# Patient Record
Sex: Male | Born: 1945 | Race: Black or African American | Hispanic: No | Smoking: Never smoker
Health system: Southern US, Community
[De-identification: ages and names within clinical notes are randomized; demographics above are authoritative.]

## PROBLEM LIST (undated history)

## (undated) DIAGNOSIS — I10 Essential (primary) hypertension: Secondary | ICD-10-CM

---

## 2012-02-19 ENCOUNTER — Other Ambulatory Visit: Payer: Self-pay | Admitting: Ophthalmology

## 2017-07-06 DIAGNOSIS — H401131 Primary open-angle glaucoma, bilateral, mild stage: Secondary | ICD-10-CM | POA: Diagnosis not present

## 2017-12-26 DIAGNOSIS — E161 Other hypoglycemia: Secondary | ICD-10-CM | POA: Diagnosis not present

## 2017-12-26 DIAGNOSIS — E162 Hypoglycemia, unspecified: Secondary | ICD-10-CM | POA: Diagnosis not present

## 2017-12-26 DIAGNOSIS — R410 Disorientation, unspecified: Secondary | ICD-10-CM | POA: Diagnosis not present

## 2017-12-26 DIAGNOSIS — R404 Transient alteration of awareness: Secondary | ICD-10-CM | POA: Diagnosis not present

## 2018-01-11 DIAGNOSIS — H401131 Primary open-angle glaucoma, bilateral, mild stage: Secondary | ICD-10-CM | POA: Diagnosis not present

## 2020-06-06 DIAGNOSIS — Z20822 Contact with and (suspected) exposure to covid-19: Secondary | ICD-10-CM | POA: Diagnosis not present

## 2020-10-09 ENCOUNTER — Encounter (HOSPITAL_COMMUNITY): Payer: Self-pay | Admitting: Emergency Medicine

## 2020-10-09 ENCOUNTER — Other Ambulatory Visit: Payer: Self-pay

## 2020-10-09 ENCOUNTER — Observation Stay (HOSPITAL_COMMUNITY)
Admission: EM | Admit: 2020-10-09 | Discharge: 2020-10-10 | Disposition: A | Discharge status: Home / Self Care | Payer: Medicare Other | Attending: Internal Medicine | Admitting: Internal Medicine

## 2020-10-09 ENCOUNTER — Emergency Department (HOSPITAL_COMMUNITY): Payer: Medicare Other

## 2020-10-09 DIAGNOSIS — I120 Hypertensive chronic kidney disease with stage 5 chronic kidney disease or end stage renal disease: Secondary | ICD-10-CM | POA: Diagnosis not present

## 2020-10-09 DIAGNOSIS — Z79899 Other long term (current) drug therapy: Secondary | ICD-10-CM | POA: Diagnosis not present

## 2020-10-09 DIAGNOSIS — R55 Syncope and collapse: Secondary | ICD-10-CM | POA: Diagnosis not present

## 2020-10-09 DIAGNOSIS — Z20822 Contact with and (suspected) exposure to covid-19: Secondary | ICD-10-CM | POA: Diagnosis not present

## 2020-10-09 DIAGNOSIS — N185 Chronic kidney disease, stage 5: Secondary | ICD-10-CM | POA: Insufficient documentation

## 2020-10-09 DIAGNOSIS — R4020 Unspecified coma: Secondary | ICD-10-CM | POA: Diagnosis present

## 2020-10-09 DIAGNOSIS — I959 Hypotension, unspecified: Secondary | ICD-10-CM | POA: Insufficient documentation

## 2020-10-09 DIAGNOSIS — N189 Chronic kidney disease, unspecified: Secondary | ICD-10-CM

## 2020-10-09 HISTORY — DX: Essential (primary) hypertension: I10

## 2020-10-09 LAB — URINALYSIS, ROUTINE W REFLEX MICROSCOPIC
Bilirubin Urine: NEGATIVE
Glucose, UA: 150 mg/dL — AB
Ketones, ur: NEGATIVE mg/dL
Leukocytes,Ua: NEGATIVE
Nitrite: NEGATIVE
Protein, ur: 30 mg/dL — AB
Specific Gravity, Urine: 1.009 (ref 1.005–1.030)
pH: 5 (ref 5.0–8.0)

## 2020-10-09 LAB — CBC WITH DIFFERENTIAL/PLATELET
Abs Immature Granulocytes: 0.01 10*3/uL (ref 0.00–0.07)
Basophils Absolute: 0.1 10*3/uL (ref 0.0–0.1)
Basophils Relative: 1 %
Eosinophils Absolute: 0.5 10*3/uL (ref 0.0–0.5)
Eosinophils Relative: 8 %
HCT: 31.7 % — ABNORMAL LOW (ref 39.0–52.0)
Hemoglobin: 10.8 g/dL — ABNORMAL LOW (ref 13.0–17.0)
Immature Granulocytes: 0 %
Lymphocytes Relative: 26 %
Lymphs Abs: 1.5 10*3/uL (ref 0.7–4.0)
MCH: 35.1 pg — ABNORMAL HIGH (ref 26.0–34.0)
MCHC: 34.1 g/dL (ref 30.0–36.0)
MCV: 102.9 fL — ABNORMAL HIGH (ref 80.0–100.0)
Monocytes Absolute: 0.6 10*3/uL (ref 0.1–1.0)
Monocytes Relative: 10 %
Neutro Abs: 3.2 10*3/uL (ref 1.7–7.7)
Neutrophils Relative %: 55 %
Platelets: 240 10*3/uL (ref 150–400)
RBC: 3.08 MIL/uL — ABNORMAL LOW (ref 4.22–5.81)
RDW: 13.2 % (ref 11.5–15.5)
WBC: 5.8 10*3/uL (ref 4.0–10.5)
nRBC: 0 % (ref 0.0–0.2)

## 2020-10-09 LAB — BASIC METABOLIC PANEL
Anion gap: 11 (ref 5–15)
BUN: 58 mg/dL — ABNORMAL HIGH (ref 8–23)
CO2: 17 mmol/L — ABNORMAL LOW (ref 22–32)
Calcium: 7.5 mg/dL — ABNORMAL LOW (ref 8.9–10.3)
Chloride: 108 mmol/L (ref 98–111)
Creatinine, Ser: 6.83 mg/dL — ABNORMAL HIGH (ref 0.61–1.24)
GFR, Estimated: 8 mL/min — ABNORMAL LOW (ref >60)
Glucose, Bld: 134 mg/dL — ABNORMAL HIGH (ref 70–99)
Potassium: 4 mmol/L (ref 3.5–5.1)
Sodium: 136 mmol/L (ref 135–145)

## 2020-10-09 LAB — RESP PANEL BY RT-PCR (FLU A&B, COVID) ARPGX2
Influenza A by PCR: NEGATIVE
Influenza B by PCR: NEGATIVE
SARS Coronavirus 2 by RT PCR: NEGATIVE

## 2020-10-09 LAB — TROPONIN I (HIGH SENSITIVITY)
Troponin I (High Sensitivity): 14 ng/L (ref <18)
Troponin I (High Sensitivity): 14 ng/L (ref <18)

## 2020-10-09 LAB — MAGNESIUM: Magnesium: 1.8 mg/dL (ref 1.7–2.4)

## 2020-10-09 MED ORDER — MELATONIN 3 MG PO TABS: 3.0000 mg | ORAL_TABLET | Freq: Every evening | ORAL | Status: DC | PRN

## 2020-10-09 MED ORDER — CARVEDILOL 3.125 MG PO TABS
3.1250 mg | ORAL_TABLET | Freq: Two times a day (BID) | ORAL | Status: DC
  Administered 2020-10-09 – 2020-10-10 (×2): 3.125 mg via ORAL
  Filled 2020-10-09 (×2): qty 1

## 2020-10-09 MED ORDER — LACTATED RINGERS IV SOLN: INTRAVENOUS | Status: DC

## 2020-10-09 MED ORDER — ATORVASTATIN CALCIUM 40 MG PO TABS
40.0000 mg | ORAL_TABLET | Freq: Every day | ORAL | Status: DC
  Administered 2020-10-09: 40 mg via ORAL
  Filled 2020-10-09: qty 1

## 2020-10-09 MED ORDER — ACETAMINOPHEN 325 MG PO TABS: 650.0000 mg | ORAL_TABLET | Freq: Four times a day (QID) | ORAL | Status: DC | PRN

## 2020-10-09 MED ORDER — CARVEDILOL 3.125 MG PO TABS: 3.1250 mg | ORAL_TABLET | Freq: Two times a day (BID) | ORAL | Status: DC

## 2020-10-09 MED ORDER — SODIUM BICARBONATE 650 MG PO TABS
1300.0000 mg | ORAL_TABLET | Freq: Three times a day (TID) | ORAL | Status: DC
  Administered 2020-10-10 (×2): 1300 mg via ORAL
  Filled 2020-10-09 (×3): qty 2

## 2020-10-09 MED ORDER — PROCHLORPERAZINE EDISYLATE 10 MG/2ML IJ SOLN: 5.0000 mg | Freq: Four times a day (QID) | INTRAMUSCULAR | Status: DC | PRN

## 2020-10-09 MED ORDER — HEPARIN SODIUM (PORCINE) 5000 UNIT/ML IJ SOLN
5000.0000 [IU] | Freq: Three times a day (TID) | INTRAMUSCULAR | Status: DC
  Administered 2020-10-09 – 2020-10-10 (×2): 5000 [IU] via SUBCUTANEOUS
  Filled 2020-10-09 (×2): qty 1

## 2020-10-09 MED ORDER — FERROUS SULFATE 325 (65 FE) MG PO TABS
325.0000 mg | ORAL_TABLET | ORAL | Status: DC
  Administered 2020-10-10: 325 mg via ORAL
  Filled 2020-10-09: qty 1

## 2020-10-09 MED ORDER — CARVEDILOL 12.5 MG PO TABS: 25.0000 mg | ORAL_TABLET | Freq: Two times a day (BID) | ORAL | Status: DC

## 2020-10-09 MED ORDER — POLYETHYLENE GLYCOL 3350 17 G PO PACK: 17.0000 g | PACK | Freq: Every day | ORAL | Status: DC | PRN

## 2020-10-09 MED ORDER — SODIUM CHLORIDE 0.9 % IV BOLUS
500.0000 mL | Freq: Once | INTRAVENOUS | Status: AC
  Administered 2020-10-09: 500 mL via INTRAVENOUS

## 2020-10-09 MED ORDER — SODIUM CHLORIDE 0.9% FLUSH
3.0000 mL | Freq: Two times a day (BID) | INTRAVENOUS | Status: DC
  Administered 2020-10-09 – 2020-10-10 (×2): 3 mL via INTRAVENOUS

## 2020-10-09 MED ORDER — INSULIN ASPART 100 UNIT/ML IJ SOLN: 0.0000 [IU] | Freq: Three times a day (TID) | INTRAMUSCULAR | Status: DC

## 2020-10-09 NOTE — ED Provider Notes (Signed)
[facility] EMERGENCY DEPARTMENT Provider Note   CSN: [id number] Arrival date & time: 10/09/20  2000     History Chief Complaint  Patient presents with   Loss of Consciousness    Douglas Esparza is a 75 y.o. male with a history of hypertension, CKD, HLD, presenting the ED with an episode of unresponsiveness.  Patient was reportedly at a neighbor's house today and had finished dinner.  He says he got hot and then had an episode of vomiting and lost consciousness.  EMS was called and when they arrived the patient was unresponsive and diaphoretic.  They report his initial systolic pressure was 60.  The patient patient in Trendelenburg position and reported the blood pressure improved on its own.  He woke up thereafter was able to talk to them.  On arrival in the ED, the patient denies a headache, chest pain, chest pressure.  He denies any recent infectious symptoms that he was feeling well until tonight.  He denies any prior history of syncope, MI, arrhythmia, cardiac disease.    He follows at the [facility] in [place].  He is seen there for his chronic kidney disease.  He is not on dialysis and does produce urine.  He does report a history of syncope once in the past, unclear when this happened, but he reports he was also in the setting of being overheated.  He denies history of smoking or aneurysm.  He denies any recent illicit drug use.  HPI     Past Medical History:  Diagnosis Date   Hypertension     Patient Active Problem List   Diagnosis Date Noted   Syncope 10/09/2020     No family history on file.  Social History   Tobacco Use   Smoking status: Never   Smokeless tobacco: Never  Substance Use Topics   Alcohol use: Yes    Alcohol/week: 6.0 standard drinks    Types: 6 Cans of beer per week   Drug use: Never    Home Medications Prior to Admission medications   Medication Sig Start Date End Date Taking? Authorizing Provider  amLODipine  (NORVASC) 10 MG tablet Take 10 mg by mouth daily.   Yes [provider]  atorvastatin (LIPITOR) 80 MG tablet Take 40 mg by mouth at bedtime.   Yes [provider]  carvedilol (COREG) 25 MG tablet Take 25 mg by mouth 2 (two) times daily with a meal.   Yes [provider]  Cholecalciferol (VITAMIN D3) 50 MCG (2000 UT) TABS Take 2,000 Units by mouth daily.   Yes [provider]  ferrous sulfate 325 (65 FE) MG tablet Take 325 mg by mouth every other day.   Yes [provider]  lisinopril (ZESTRIL) 40 MG tablet Take 40 mg by mouth daily.   Yes [provider]  Multiple Vitamins-Minerals (CENTRUM SILVER ADULT 50+ PO) Take 1 tablet by mouth daily.   Yes [provider]  OVER THE COUNTER MEDICATION Take 2 capsules by mouth daily. Fruit and vegetable vitamin   Yes [provider]  sodium bicarbonate 650 MG tablet Take 650 mg by mouth 2 (two) times daily.   Yes [provider]    Allergies    Patient has no known allergies.  Review of Systems   Review of Systems  Constitutional:  Negative for chills and fever.  HENT:  Negative for ear pain and sore throat.   Eyes:  Negative for pain and visual disturbance.  Respiratory:  Negative for cough and shortness of breath.   Cardiovascular:  Negative for chest pain and palpitations.  Gastrointestinal:  Negative for abdominal pain and vomiting.  Genitourinary:  Negative for dysuria and hematuria.  Musculoskeletal:  Negative for arthralgias and back pain.  Skin:  Negative for color change and rash.  Neurological:  Positive for syncope and light-headedness. Negative for weakness and numbness.  All other systems reviewed and are negative.  Physical Exam Updated Vital Signs BP (!) 141/88 (BP Location: Left Arm)   Pulse 60   Temp 97.6 F (36.4 C) (Oral)   Resp 15   Ht $R'5\' 10"'iy$  (1.778 m)   Wt 74.8 kg   SpO2 100%   BMI 23.68 kg/m   Physical Exam Constitutional:       General: He is not in acute distress. HENT:     Head: Normocephalic and atraumatic.  Eyes:     Conjunctiva/sclera: Conjunctivae normal.     Pupils: Pupils are equal, round, and reactive to light.  Cardiovascular:     Rate and Rhythm: Normal rate and regular rhythm.     Pulses: Normal pulses.  Pulmonary:     Effort: Pulmonary effort is normal. No respiratory distress.  Abdominal:     General: There is no distension.     Tenderness: There is no abdominal tenderness.  Skin:    General: Skin is warm and dry.  Neurological:     General: No focal deficit present.     Mental Status: He is alert. Mental status is at baseline.  Psychiatric:        Mood and Affect: Mood normal.        Behavior: Behavior normal.    ED Results / Procedures / Treatments   Labs (all labs ordered are listed, but only abnormal results are displayed) Labs Reviewed  BASIC METABOLIC PANEL - Abnormal; Notable for the following components:      Result Value   CO2 17 (*)    Glucose, Bld 134 (*)    BUN 58 (*)    Creatinine, Ser 6.83 (*)    Calcium 7.5 (*)    GFR, Estimated 8 (*)    All other components within normal limits  CBC WITH DIFFERENTIAL/PLATELET - Abnormal; Notable for the following components:   RBC 3.08 (*)    Hemoglobin 10.8 (*)    HCT 31.7 (*)    MCV 102.9 (*)    MCH 35.1 (*)    All other components within normal limits  URINALYSIS, ROUTINE W REFLEX MICROSCOPIC - Abnormal; Notable for the following components:   Color, Urine STRAW (*)    Glucose, UA 150 (*)    Hgb urine dipstick MODERATE (*)    Protein, ur 30 (*)    Bacteria, UA RARE (*)    All other components within normal limits  RESP PANEL BY RT-PCR (FLU A&B, COVID) ARPGX2  MAGNESIUM  TROPONIN I (HIGH SENSITIVITY)  TROPONIN I (HIGH SENSITIVITY)    EKG EKG Interpretation  Date/Time:  Wednesday October 09 2020 21:08:22 EDT Ventricular Rate:  63 PR Interval:  47 QRS Duration: 155 QT Interval:  483 QTC Calculation: 495 R  Axis:   25 Text Interpretation: Sinus rhythm Short PR interval Nonspecific intraventricular conduction delay Baseline artifact in inferior leads , no STEMI Confirmed by Octaviano Glow [phone]) on 10/09/2020 9:14:49 PM  Radiology DG Chest Portable 1 View  Result Date: 10/09/2020 CLINICAL DATA:  Syncopal episode EXAM: PORTABLE CHEST 1 VIEW COMPARISON:  None. FINDINGS: The  heart size and mediastinal contours are within normal limits. Both lungs are clear. The visualized skeletal structures are unremarkable. IMPRESSION: No active disease. Electronically Signed   By: Inez Catalina M.D.   On: 10/09/2020 20:44    Procedures Procedures   Medications Ordered in ED Medications  sodium chloride 0.9 % bolus 500 mL (500 mLs Intravenous New Bag/Given 10/09/20 2204)    ED Course  I have reviewed the triage vital signs and the nursing notes.  Pertinent labs & imaging results that were available during my care of the patient were reviewed by me and considered in my medical decision making (see chart for details).  This patient presents with hypotension and syncope.  This involves an extensive number of treatment options, and is a complaint that carries with it a high risk of complications and morbidity.  The differential diagnosis includes arrhythmia vs ACS vs anemia vs dehydration vs infection vs other  I ordered, reviewed, and interpreted labs.  No life-threatening abnormalities were noted on these tests.  Creatinine is near baseline which is elevated near 6.  Initial troponin is 14.  Repeat is pending.  Electrolytes within normal limits.  UA without any significant signs of infection. I ordered medication IV fluids for possible dehydration. I ordered imaging studies which included x-ray of the chest I independently visualized and interpreted imaging which showed no life-threatening abnormalities, and the monitor tracing which showed normal sinus rhythm EKG reviewed showing a sinus rhythm with no acute  ischemic findings.  Orthostatic vital signs obtained and reviewed with no significant findings.  At this point, we discussed admission to the hospital for syncope observation and echocardiogram in the morning.  The patient is in agreement.  I have a lower suspicion at this time for acute PE, acute anemia, ACS, pneumothorax, ruptured aneurysm.   Clinical Course as of 10/09/20 2310  Wed Oct 09, 2020  2051 No significant anemia or leukocytosis.  Blood pressure is normal or hypertensive on arrival [MT]  2134 Follows at [facility] Oct 08, 2020 09:41 AM [facility] CHEM7/CA++ Specimen Type: SERUM Comment: Interpretive Ranges for eGFR (CKD-EPI 2021): eGFR >90 = Good kidney function eGFR 60 - 89 = Normal to Mild decrease, based on age and sex eGFR >60 = Normal eGFR 30 - 59 Moderately decreased eGFR 15 - 29 Severely decreased eGFR < 15 Kidney failure Ordering Provider: Darlina Rumpf Report Released Date/Time: Aug 16, 2020 09:33 AM Reporting Lab: [facility] [address] [place] [address] Performing Lab: [place] [address]     CREATININE 6.6 mg/dL H 0.6-1.4       UREA NITROGEN 61 mg/dL H 8-23       GLUCOSE 214 mg/dL H 70-110       SODIUM 137 mmol/L   134-144       POTASSIUM 4.5 mmol/L   3.5-5.4       CHLORIDE 109 mmol/L   100-110       CO2 18 mmol/L L 22-31       CALCIUM 7.3 mg/dL L 8.5-10.1       GFR ESTIMATION (CKD-EPI) 8 mL/min   >=90   [MT]  2134 Cr 6.6 near baseline. [MT]  2134 Trop 14 [MT]  2135 Orthostatic VS negative. [MT]  2235 Admitted to hospitalist for observation overnight, possible echocardiogram in the morning. [MT]    Clinical Course User Index [MT] Chaniyah Jahr, Carola Rhine, MD    Final Clinical Impression(s) /  ED Diagnoses Final diagnoses:  Syncope, unspecified syncope type  Chronic kidney disease, unspecified CKD stage  Hypotension, unspecified hypotension type    Rx / DC Orders ED Discharge  Orders     None        Wyvonnia Dusky, MD 10/09/20 2311

## 2020-10-09 NOTE — ED Triage Notes (Signed)
Pt arrives via [facility] for syncopal episode. Pt was at neighbors house, had an episode of emesis and then lost consciousness. Upon Ems arrival pt was unresponsive and diaphoretic, SBP 60 per fire dept. Pt placed in trendelenburg and started coming around.   128/72 NSR 70 CBG 156

## 2020-10-09 NOTE — H&P (Addendum)
History and Physical  Douglas Esparza [id number] DOB: 30-Jan-1946 DOA: 10/09/2020  Referring physician: EDP, Dr. Langston Masker. PCP: Pcp, No  Outpatient Specialists: Follows at the [facility] hospital in [place]. Patient coming from: Home.   Chief Complaint: Passed out when eating dinner  HPI: Douglas Esparza is a 75 y.o. male with medical history significant for CKD 5, essential hypertension, hyperlipidemia, anemia of chronic disease, metabolic acidosis on sodium bicarbonate tablet, IgG kappa light chain smoldering myeloma, prediabetes, who presented to [facility] ED via EMS after passing out when eating dinner at a friend's house on the day of presentation.  Patient was in his usual state of health prior to this.  No prodrome.  He recalls waking up in the ambulance.  States he was soaked with urine and vomit.  No tongue biting.  Denies any nausea, abdominal pain, diarrhea or any GI symptoms at the time of this visit.  No cardiopulmonary symptoms.  History is mainly obtained from EDP and review of medical records.  Upon EMS arrival, patient's BP was 60/40.  He was placed on Trendelenburg position with improvement of SBP.  Reports his blood pressure normally runs high at home and at the doctor's office.  He was seen the day prior to presentation by his provider at the [facility] and at that time his SBP was in the 150s.  Patient was brought into the ED for further evaluation.  Work-up in the ED was unrevealing, negative orthostatic vital signs.  EDP requested admission for syncope work-up.  TRH, hospitalist team, was asked to admit.  ED Course:  Temperature 97.6.  BP 145/85, pulse 60, respiration rate 15, O2 saturation 100% on room air.  Lab studies remarkable for serum sodium 136, potassium 4.0, chloride 108, serum bicarb 17, glucose 134, BUN 58, creatinine 6.83, baseline creatinine 6 per EDP, anion gap 11, magnesium 1.8, GFR 8.0.  Troponin 14, normal, WBC 5.8, hemoglobin 10.8, platelet count 240.  UA equivocal and chest  x-ray essentially unremarkable.  Review of Systems: Review of systems as noted in the HPI. All other systems reviewed and are negative.   Past Medical History:  Diagnosis Date   Hypertension   Rest of past medical history as stated in the HPI.  Social History:  reports that he has never smoked. He has never used smokeless tobacco. He reports current alcohol use of about 6.0 standard drinks of alcohol per week. He reports that he does not use drugs.   No Known Allergies  Family history: Brother with history of diabetes and blindness.  Prior to Admission medications   Medication Sig Start Date End Date Taking? Authorizing Provider  amLODipine (NORVASC) 10 MG tablet Take 10 mg by mouth daily.   Yes [provider]  atorvastatin (LIPITOR) 80 MG tablet Take 40 mg by mouth at bedtime.   Yes [provider]  carvedilol (COREG) 25 MG tablet Take 25 mg by mouth 2 (two) times daily with a meal.   Yes [provider]  Cholecalciferol (VITAMIN D3) 50 MCG (2000 UT) TABS Take 2,000 Units by mouth daily.   Yes [provider]  ferrous sulfate 325 (65 FE) MG tablet Take 325 mg by mouth every other day.   Yes [provider]  lisinopril (ZESTRIL) 40 MG tablet Take 40 mg by mouth daily.   Yes [provider]  Multiple Vitamins-Minerals (CENTRUM SILVER ADULT 50+ PO) Take 1 tablet by mouth daily.   Yes [provider]  OVER THE COUNTER MEDICATION Take  2 capsules by mouth daily. Fruit and vegetable vitamin   Yes [provider]  sodium bicarbonate 650 MG tablet Take 650 mg by mouth 2 (two) times daily.   Yes [provider]    Physical Exam: BP (!) 141/88 (BP Location: Left Arm)   Pulse 60   Temp 97.6 F (36.4 C) (Oral)   Resp 15   Ht '5\' 10"'$  (1.778 m)   Wt 74.8 kg   SpO2 100%   BMI 23.68 kg/m   General: 75 y.o. year-old male well developed well nourished in no acute distress.  Alert and oriented  x3. Cardiovascular: Regular rate and rhythm with no rubs or gallops.  No thyromegaly or JVD noted.  No lower extremity edema. 2/4 pulses in all 4 extremities. Respiratory: Clear to auscultation with no wheezes or rales. Good inspiratory effort. Abdomen: Soft nontender nondistended with normal bowel sounds x4 quadrants. Muskuloskeletal: No cyanosis, clubbing or edema noted bilaterally Neuro: CN II-XII intact, strength, sensation, reflexes Skin: No ulcerative lesions noted or rashes Psychiatry: Judgement and insight appear normal. Mood is appropriate for condition and setting          Labs on Admission:  Basic Metabolic Panel: Recent Labs  Lab 10/09/20 2017  NA 136  K 4.0  CL 108  CO2 17*  GLUCOSE 134*  BUN 58*  CREATININE 6.83*  CALCIUM 7.5*  MG 1.8   Liver Function Tests: No results for input(s): AST, ALT, ALKPHOS, BILITOT, PROT, ALBUMIN in the last 168 hours. No results for input(s): LIPASE, AMYLASE in the last 168 hours. No results for input(s): AMMONIA in the last 168 hours. CBC: Recent Labs  Lab 10/09/20 2017  WBC 5.8  NEUTROABS 3.2  HGB 10.8*  HCT 31.7*  MCV 102.9*  PLT 240   Cardiac Enzymes: No results for input(s): CKTOTAL, CKMB, CKMBINDEX, TROPONINI in the last 168 hours.  BNP (last 3 results) No results for input(s): BNP in the last 8760 hours.  ProBNP (last 3 results) No results for input(s): PROBNP in the last 8760 hours.  CBG: No results for input(s): GLUCAP in the last 168 hours.  Radiological Exams on Admission: DG Chest Portable 1 View  Result Date: 10/09/2020 CLINICAL DATA:  Syncopal episode EXAM: PORTABLE CHEST 1 VIEW COMPARISON:  None. FINDINGS: The heart size and mediastinal contours are within normal limits. Both lungs are clear. The visualized skeletal structures are unremarkable. IMPRESSION: No active disease. Electronically Signed   By: Inez Catalina M.D.   On: 10/09/2020 20:44    EKG: I independently viewed the EKG done and my findings  are as followed: Sinus rhythm rate of 63, nonspecific ST-T changes.  QTc 495.  Assessment/Plan Present on Admission:  Syncope  Active Problems:   Syncope  Syncope, unclear etiology Admitted for syncope work-up Presented after passing out when eating dinner at a friend's house, on EMS arrival, patient's BP 60/40.  States his blood pressure usually runs high. Obtain 2D echo to rule out any cardiac structural abnormalities. Reports urine incontinence after regaining consciousness, no prodromal symptoms Obtain EEG to rule out active seizure activity. Seizure precautions Negative orthostatic hypotension Repeat orthostatic vital signs in the morning. Received IV fluid bolus normal saline 500 cc in the ED.  Essential hypertension, recently hypotensive. BP is currently stable Will hold off home amlodipine 10 mg daily and lisinopril 40 mg daily for now until blood pressure comes up. Restarted home Coreg at a lower dose 3.125 mg twice daily instead of home dose of 25  mg twice daily for now to avoid recurrence of hypotension. Closely monitor vital signs on telemetry.  Non anion gap metabolic acidosis in the setting of advanced CKD 5. Presented with serum bicarb of 17 and anion gap of 11 Started sodium bicarb oral supplement 1300 mg 3 times daily x1 day. Resume his home sodium bicarbonate supplement at discharge.  CKD stage V Patient gets his medical care, sees a nephrologist, at the [facility] hospital in [place]. Patient appears to be at his baseline creatinine, per medical record baseline creatinine 6.6 with GFR of 8. Gentle IV fluid hydration LR 50 cc/h x 1 day. Monitor urine output Avoid nephrotoxic agents, dehydration and hypotension Repeat renal panel in the morning  Anemia of chronic disease/iron deficiency anemia Resume home iron supplement Hemoglobin is stable 10.8 with no overt bleeding. Repeat CBC in the morning  IgG kappa light chain smoldering myeloma, followed by heme  oncology Will need to follow-up outpatient with medical oncology  Prediabetes with hyperglycemia Obtain hemoglobin A1c Not on hypoglycemics at home. Start insulin sliding scale, sensitive Avoid hypoglycemia.  Hyperlipidemia Resume home Lipitor.  Mild pyuria UA WBC 6-10, rare bacteria on presenting urinalysis. Give 1 dose of fosfomycin.   DVT prophylaxis: Subcu heparin 3 times daily.  Code Status: Full code  Family Communication: None at bedside.  Disposition Plan: Admit to telemetry medical.  Consults called: None.  Admission status: Observation status   Status is: Observation    Dispo:  Patient From: Home  Planned Disposition: Home, possibly on 10/10/2020.  Medically stable for discharge: No      Kayleen Memos MD [facility] Pager [phone]  If 7PM-7AM, please contact night-coverage [email or web address] Password [facility]  10/09/2020, 11:20 PM

## 2020-10-10 ENCOUNTER — Observation Stay (HOSPITAL_BASED_OUTPATIENT_CLINIC_OR_DEPARTMENT_OTHER): Payer: Medicare Other

## 2020-10-10 DIAGNOSIS — R55 Syncope and collapse: Secondary | ICD-10-CM

## 2020-10-10 LAB — RENAL FUNCTION PANEL
Albumin: 3.3 g/dL — ABNORMAL LOW (ref 3.5–5.0)
Anion gap: 10 (ref 5–15)
BUN: 56 mg/dL — ABNORMAL HIGH (ref 8–23)
CO2: 18 mmol/L — ABNORMAL LOW (ref 22–32)
Calcium: 7.2 mg/dL — ABNORMAL LOW (ref 8.9–10.3)
Chloride: 109 mmol/L (ref 98–111)
Creatinine, Ser: 6.48 mg/dL — ABNORMAL HIGH (ref 0.61–1.24)
GFR, Estimated: 8 mL/min — ABNORMAL LOW (ref >60)
Glucose, Bld: 97 mg/dL (ref 70–99)
Phosphorus: 4.5 mg/dL (ref 2.5–4.6)
Potassium: 3.8 mmol/L (ref 3.5–5.1)
Sodium: 137 mmol/L (ref 135–145)

## 2020-10-10 LAB — HEMOGLOBIN A1C
Hgb A1c MFr Bld: 5.7 % — ABNORMAL HIGH (ref 4.8–5.6)
Mean Plasma Glucose: 116.89 mg/dL

## 2020-10-10 LAB — CBC
HCT: 31 % — ABNORMAL LOW (ref 39.0–52.0)
Hemoglobin: 10.4 g/dL — ABNORMAL LOW (ref 13.0–17.0)
MCH: 34.3 pg — ABNORMAL HIGH (ref 26.0–34.0)
MCHC: 33.5 g/dL (ref 30.0–36.0)
MCV: 102.3 fL — ABNORMAL HIGH (ref 80.0–100.0)
Platelets: 247 10*3/uL (ref 150–400)
RBC: 3.03 MIL/uL — ABNORMAL LOW (ref 4.22–5.81)
RDW: 13.2 % (ref 11.5–15.5)
WBC: 9 10*3/uL (ref 4.0–10.5)
nRBC: 0 % (ref 0.0–0.2)

## 2020-10-10 LAB — CBG MONITORING, ED
Glucose-Capillary: 88 mg/dL (ref 70–99)
Glucose-Capillary: 76 mg/dL (ref 70–99)
Glucose-Capillary: 114 mg/dL — ABNORMAL HIGH (ref 70–99)

## 2020-10-10 LAB — ECHOCARDIOGRAM COMPLETE
Area-P 1/2: 3.48 cm2
Height: 70 in
P 1/2 time: 428 msec
S' Lateral: 2.9 cm
Weight: 2640 oz

## 2020-10-10 MED ORDER — CARVEDILOL 3.125 MG PO TABS: 3.1250 mg | ORAL_TABLET | Freq: Two times a day (BID) | ORAL | 0 refills | Status: AC

## 2020-10-10 MED ORDER — FOSFOMYCIN TROMETHAMINE 3 G PO PACK
3.0000 g | PACK | Freq: Once | ORAL | Status: AC
  Administered 2020-10-10: 3 g via ORAL
  Filled 2020-10-10: qty 3

## 2020-10-10 NOTE — Progress Notes (Signed)
Physical Therapy Evaluation Patient Details Name: Douglas Esparza MRN: [id number] DOB: 1945-11-21 Today's Date: 10/10/2020   History of Present Illness  BARTLETT Esparza is a 75 y.o. male admitted 6/22 who presented to [facility] ED via EMS after passing out when eating dinner at a friend's house on the day of presentation. PMH:  CKD 5, essential hypertension, hyperlipidemia, anemia of chronic disease, metabolic acidosis on sodium bicarbonate tablet, IgG kappa light chain smoldering myeloma, prediabetes  Clinical Impression  Pt admitted with above diagnosis. Pt was able to ambulate in room with good steady gait.  BP's were elevated and notified nursing.  See below.  Should progress well.  Will follow acutely.  Pt currently with functional limitations due to the deficits listed below (see PT Problem List). Pt will benefit from skilled PT to increase their independence and safety with mobility to allow discharge to the venue listed below.       10/10/20 1151                         Orthostatic Lying   BP- Lying 173/77  Pulse- Lying 65  Orthostatic Sitting  BP- Sitting (!) 186/96  Pulse- Sitting 69  Orthostatic Standing at 0 minutes  BP- Standing at 0 minutes (!) 200/105  Pulse- Standing at 0 minutes 69  Orthostatic Standing at 3 minutes  BP- Standing at 3 minutes (!) 198/99  Pulse- Standing at 3 minutes 70  Oxygen Therapy  SpO2 99 %  O2 Device Room Air    Follow Up Recommendations No PT follow up    Equipment Recommendations  None recommended by PT    Recommendations for Other Services       Precautions / Restrictions Precautions Precautions: Fall Restrictions Weight Bearing Restrictions: No      Mobility  Bed Mobility Overal bed mobility: Independent                  Transfers Overall transfer level: Independent                  Ambulation/Gait Ambulation/Gait assistance: Supervision Gait Distance (Feet): 50 Feet Assistive device: None Gait  Pattern/deviations: WFL(Within Functional Limits)   Gait velocity interpretation: <1.31 ft/sec, indicative of household ambulator General Gait Details: No LOB and steady.  Slow gait but pt states he takes his time.  Stairs            Wheelchair Mobility    Modified Rankin (Stroke Patients Only)       Balance Overall balance assessment: Needs assistance Sitting-balance support: No upper extremity supported;Feet supported Sitting balance-Leahy Scale: Good     Standing balance support: No upper extremity supported;During functional activity Standing balance-Leahy Scale: Fair Standing balance comment: can withstand min challenges to balance                             Pertinent Vitals/Pain Pain Assessment: No/denies pain    Home Living Family/patient expects to be discharged to:: Private residence Living Arrangements: Alone Available Help at Discharge: Family;Available PRN/intermittently Type of Home: House Home Access: Stairs to enter Entrance Stairs-Rails: None Entrance Stairs-Number of Steps: 4 Home Layout: One level Home Equipment: None      Prior Function Level of Independence: Independent         Comments: Drives, retired     Engineer, manufacturing [employer]   Dominant Hand: Right    Extremity/Trunk Assessment   Upper Extremity  Assessment Upper Extremity Assessment: Defer to OT evaluation    Lower Extremity Assessment Lower Extremity Assessment: Overall WFL for tasks assessed    Cervical / Trunk Assessment Cervical / Trunk Assessment: Normal  Communication   Communication: No difficulties  Cognition Arousal/Alertness: Awake/alert Behavior During Therapy: WFL for tasks assessed/performed Overall Cognitive Status: Within Functional Limits for tasks assessed                                        General Comments General comments (skin integrity, edema, etc.): 67 bpm, 100% RA, 22, 169/80    Exercises     Assessment/Plan     PT Assessment Patient needs continued PT services  PT Problem List Decreased activity tolerance;Decreased balance;Decreased mobility;Decreased knowledge of use of DME;Cardiopulmonary status limiting activity;Decreased knowledge of precautions;Decreased safety awareness       PT Treatment Interventions DME instruction;Gait training;Functional mobility training;Therapeutic activities;Therapeutic exercise;Stair training;Balance training;Patient/family education    PT Goals (Current goals can be found in the Care Plan section)  Acute Rehab PT Goals Patient Stated Goal: to go home PT Goal Formulation: With patient Time For Goal Achievement: 10/24/20 Potential to Achieve Goals: Good    Frequency Min 3X/week   Barriers to discharge        Co-evaluation               AM-PAC PT "6 Clicks" Mobility  Outcome Measure Help needed turning from your back to your side while in a flat bed without using bedrails?: None Help needed moving from lying on your back to sitting on the side of a flat bed without using bedrails?: None Help needed moving to and from a bed to a chair (including a wheelchair)?: A Little Help needed standing up from a chair using your arms (e.g., wheelchair or bedside chair)?: A Little Help needed to walk in hospital room?: A Little Help needed climbing 3-5 steps with a railing? : A Little 6 Click Score: 20    End of Session Equipment Utilized During Treatment: Gait belt Activity Tolerance: Patient limited by fatigue Patient left: in bed;with call bell/phone within reach (on stretcher) Nurse Communication: Mobility status (High BP) PT Visit Diagnosis: Muscle weakness (generalized) (M62.81)    Time: [id number] PT Time Calculation (min) (ACUTE ONLY): 22 min   Charges:   PT Evaluation $PT Eval Moderate Complexity: 1 Mod          Lavayah Vita M,PT Acute Rehab Services [phone] (pager)   Alvira Philips 10/10/2020, 1:52 PM

## 2020-10-10 NOTE — Progress Notes (Signed)
  Echocardiogram 2D Echocardiogram has been performed.  Douglas Esparza 10/10/2020, 12:00 PM

## 2020-10-11 ENCOUNTER — Other Ambulatory Visit: Payer: Self-pay | Admitting: Physician Assistant

## 2020-10-11 ENCOUNTER — Ambulatory Visit: Payer: Medicare Other

## 2020-10-11 DIAGNOSIS — R55 Syncope and collapse: Secondary | ICD-10-CM

## 2020-10-11 NOTE — Progress Notes (Unsigned)
Enrolled patient for a 14 day Zio AT monitor to be mailed to patients home.  

## 2020-10-24 ENCOUNTER — Telehealth: Payer: Self-pay | Admitting: *Deleted

## 2020-10-24 NOTE — Telephone Encounter (Signed)
Hello,  This is a courtesy note to inform you the patient below has received their Zio AT device, but it has not been activated.  We have been unable to contact the patient.  A message with our call back information was left on the patient's voicemail.  Patient [facility] MRN#: [id number] Delivery Date: 10/14/20  Reference #: [id number]  Barbette Or, [employer] Customer Care [phone]    ref:_[id number]:ref

## 2020-11-01 ENCOUNTER — Ambulatory Visit: Payer: Medicare Other | Admitting: Cardiovascular Disease

## 2021-10-16 ENCOUNTER — Encounter (INDEPENDENT_AMBULATORY_CARE_PROVIDER_SITE_OTHER): Payer: Self-pay

## 2021-10-20 ENCOUNTER — Encounter (INDEPENDENT_AMBULATORY_CARE_PROVIDER_SITE_OTHER): Payer: Medicare Other | Admitting: Ophthalmology

## 2021-10-23 ENCOUNTER — Ambulatory Visit (INDEPENDENT_AMBULATORY_CARE_PROVIDER_SITE_OTHER): Payer: Medicare Other | Admitting: Ophthalmology

## 2021-10-23 ENCOUNTER — Encounter (INDEPENDENT_AMBULATORY_CARE_PROVIDER_SITE_OTHER): Payer: Self-pay | Admitting: Ophthalmology

## 2021-10-23 DIAGNOSIS — H34832 Tributary (branch) retinal vein occlusion, left eye, with macular edema: Secondary | ICD-10-CM | POA: Diagnosis not present

## 2021-10-23 DIAGNOSIS — H2513 Age-related nuclear cataract, bilateral: Secondary | ICD-10-CM | POA: Diagnosis not present

## 2021-10-23 MED ORDER — BEVACIZUMAB 2.5 MG/0.1ML IZ SOSY
2.5000 mg | PREFILLED_SYRINGE | INTRAVITREAL | Status: AC | PRN
  Administered 2021-10-23: 2.5 mg via INTRAVITREAL

## 2021-10-23 NOTE — Assessment & Plan Note (Addendum)
May proceed with CE IOL OU at any time of the direction and opinon of Dr. Clent Jacks

## 2021-10-23 NOTE — Progress Notes (Signed)
10/23/2021     CHIEF COMPLAINT Patient presents for  Chief Complaint  Patient presents with   Retina Evaluation      HISTORY OF PRESENT ILLNESS: Douglas Esparza is a 76 y.o. male who presents to the clinic today for:   HPI     Retina Evaluation           Laterality: left eye   Associated Symptoms: Negative for Flashes, Floaters, Distortion, Blind Spot, Pain, Redness, Photophobia, Glare, Trauma, Scalp Tenderness, Jaw Claudication, Shoulder/Hip pain, Fever, Weight Loss and Fatigue         Comments   NP- ERM OS w/ Significant macular edema in OS, Ref by [place]. Pt stated, "I can see but it is cloudy. I can see what's on the computer screen but its hard to depict exactly what is it. The [facility] doctor told me to stop taking my medications and stated I'm no longer diabetic." Pt stated vision is blurry.         Last edited by Silvestre Moment on 10/23/2021  9:12 AM.      Referring physician: Clent Jacks, MD [address] [place],  [place] [address]  HISTORICAL INFORMATION:   Selected notes from the MEDICAL RECORD NUMBER    Lab Results  Component Value Date   HGBA1C 5.7 (H) 10/10/2020     CURRENT MEDICATIONS: No current outpatient medications on file. (Ophthalmic Drugs)   No current facility-administered medications for this visit. (Ophthalmic Drugs)   Current Outpatient Medications (Other)  Medication Sig   amLODipine (NORVASC) 10 MG tablet Take 10 mg by mouth daily.   atorvastatin (LIPITOR) 80 MG tablet Take 40 mg by mouth at bedtime.   carvedilol (COREG) 3.125 MG tablet Take 1 tablet (3.125 mg total) by mouth 2 (two) times daily with a meal.   Cholecalciferol (VITAMIN D3) 50 MCG (2000 UT) TABS Take 2,000 Units by mouth daily.   ferrous sulfate 325 (65 FE) MG tablet Take 325 mg by mouth every other day.   Multiple Vitamins-Minerals (CENTRUM SILVER ADULT 50+ PO) Take 1 tablet by mouth daily.   OVER THE COUNTER MEDICATION Take 2 capsules by mouth daily. Fruit and  vegetable vitamin   sodium bicarbonate 650 MG tablet Take 650 mg by mouth 2 (two) times daily.   No current facility-administered medications for this visit. (Other)      REVIEW OF SYSTEMS: ROS   Negative for: Constitutional, Gastrointestinal, Neurological, Skin, Genitourinary, Musculoskeletal, HENT, Endocrine, Cardiovascular, Eyes, Respiratory, Psychiatric, Allergic/Imm, Heme/Lymph Last edited by Silvestre Moment on 10/23/2021  9:12 AM.       ALLERGIES No Known Allergies  PAST MEDICAL HISTORY Past Medical History:  Diagnosis Date   Hypertension    History reviewed. No pertinent surgical history.  FAMILY HISTORY History reviewed. No pertinent family history.  SOCIAL HISTORY Social History   Tobacco Use   Smoking status: Never   Smokeless tobacco: Never  Substance Use Topics   Alcohol use: Yes    Alcohol/week: 6.0 standard drinks of alcohol    Types: 6 Cans of beer per week   Drug use: Never         OPHTHALMIC EXAM:  Base Eye Exam     Visual Acuity (ETDRS)       Right Left   Dist cc 20/30 -1 20/80 -2   Dist ph cc 20/20 -2 20/50 -2    Correction: Glasses         Tonometry (Tonopen, 9:19 AM)  Right Left   Pressure 18 19         Pupils       Pupils APD   Right PERRL None   Left PERRL None         Visual Fields       Left Right    Full Full         Extraocular Movement       Right Left    Full Full         Neuro/Psych     Oriented x3: Yes         Dilation     Both eyes: 1.0% Mydriacyl, 2.5% Phenylephrine @ 9:19 AM           Slit Lamp and Fundus Exam     External Exam       Right Left   External Normal Normal         Slit Lamp Exam       Right Left   Lids/Lashes Normal Normal   Conjunctiva/Sclera White and quiet White and quiet   Cornea Clear Clear   Anterior Chamber Deep and quiet Deep and quiet   Iris Round and reactive Round and reactive   Lens 3+ Nuclear sclerosis,  3+ Nuclear sclerosis   Anterior  Vitreous Normal Normal         Fundus Exam       Right Left   Posterior Vitreous Normal Normal   Disc Normal Normal   C/D Ratio 0.45 0.45   Macula Microaneurysms Moderate clinically significant macular edema or macular BRVO superiorly. Tough to see details thru lens   Vessels Normal Normal   Periphery Normal Normal            IMAGING AND PROCEDURES  Imaging and Procedures for 10/23/21  OCT, Retina - OU - Both Eyes       Right Eye Quality was good. Scan locations included subfoveal. Central Foveal Thickness: 227. Progression has no prior data. Findings include normal foveal contour.   Left Eye Quality was good. Scan locations included subfoveal. Central Foveal Thickness: 584. Progression has no prior data. Findings include cystoid macular edema.   Notes Involved CSME with serous retinal detachment in conjunction with likely BRVO of the macula OS.     Color Fundus Photography Optos - OU - Both Eyes       Right Eye Progression has no prior data. Disc findings include normal observations. Macula : microaneurysms. Vessels : normal observations. Periphery : normal observations.   Left Eye Progression has no prior data. Disc findings include normal observations. Macula : edema. Periphery : normal observations.   Notes Macular BRVO, CME OS     Intravitreal Injection, Pharmacologic Agent - OS - Left Eye       Time Out 10/23/2021. 10:19 AM. Confirmed correct patient, procedure, site, and patient consented.   Anesthesia Topical anesthesia was used. Anesthetic medications included Lidocaine 4%.   Procedure Preparation included 5% betadine to ocular surface, 10% betadine to eyelids. A 30 gauge needle was used.   Injection: 2.5 mg bevacizumab 2.5 MG/0.1ML   Route: Intravitreal, Site: Left Eye   NDC: [phone], Lot: [id number], Expiration date: 12/26/2021   Post-op Post injection exam found visual acuity of at least counting fingers. The patient tolerated the  procedure well. There were no complications. The patient received written and verbal post procedure care education.              ASSESSMENT/PLAN:  Nuclear sclerotic  cataract of both eyes May proceed with CE IOL OU at any time of the direction and opinon of Dr. 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  Follow-up again in 5 weeks.      ICD-10-CM   1. 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 Nuclear sclerotic cataract of both eyes  H25.13       1.  2.  3.  Ophthalmic Meds Ordered this visit:  Meds ordered this encounter  Medications   bevacizumab (AVASTIN) SOSY 2.5 mg       Return in about 5 weeks (around 11/27/2021) for dilate, OS, AVASTIN OCT.  There are no Patient Instructions on file for this visit.   Explained the diagnoses, plan, and follow up with the patient and they expressed understanding.  Patient expressed understanding of the importance of proper follow up care.   Clent Demark Kareema Keitt M.D. Diseases & Surgery of the Retina and Vitreous Retina [facility] Diabetic [place] 10/23/21     Abbreviations: M myopia (nearsighted); A astigmatism; H hyperopia (farsighted); P presbyopia; Mrx spectacle prescription;  CTL contact lenses; OD right eye; OS left eye; OU both eyes  XT exotropia; ET esotropia; PEK punctate epithelial keratitis; PEE punctate epithelial erosions; DES dry eye syndrome; MGD meibomian gland dysfunction; ATs artificial tears; PFAT's preservative free artificial tears; [place] nuclear sclerotic cataract; PSC posterior subcapsular cataract; ERM epi-retinal membrane; PVD posterior  vitreous detachment; RD retinal detachment; DM diabetes mellitus; DR diabetic retinopathy; NPDR non-proliferative diabetic retinopathy; PDR proliferative diabetic retinopathy; CSME clinically significant macular edema; DME diabetic macular edema; dbh dot blot hemorrhages; CWS cotton wool spot; POAG primary open angle glaucoma; C/D cup-to-disc ratio; HVF humphrey visual field; GVF goldmann visual field; OCT optical coherence tomography; IOP intraocular pressure; BRVO Branch retinal vein occlusion; CRVO central retinal vein occlusion; CRAO central retinal artery occlusion; BRAO branch retinal artery occlusion; RT retinal tear; SB scleral buckle; PPV pars plana vitrectomy; VH Vitreous hemorrhage; PRP panretinal laser photocoagulation; IVK intravitreal kenalog; VMT vitreomacular traction; MH Macular hole;  NVD neovascularization of the disc; NVE neovascularization elsewhere; AREDS age related eye disease study; ARMD age related macular degeneration; POAG primary open angle glaucoma; EBMD epithelial/anterior basement membrane dystrophy; ACIOL anterior chamber intraocular lens; IOL intraocular lens; PCIOL posterior chamber intraocular lens; Phaco/IOL phacoemulsification with intraocular lens placement; [place] photorefractive keratectomy; LASIK laser assisted in situ keratomileusis; HTN hypertension; DM diabetes mellitus; COPD chronic obstructive pulmonary disease

## 2021-10-23 NOTE — Assessment & Plan Note (Signed)
Macular BRVO with secondary  CME we will recommend and have offered and have commence with therapy today with intravitreal Avastin.  Follow-up again in 5 weeks.

## 2021-11-27 ENCOUNTER — Ambulatory Visit (INDEPENDENT_AMBULATORY_CARE_PROVIDER_SITE_OTHER): Payer: Medicare Other | Admitting: Ophthalmology

## 2021-11-27 ENCOUNTER — Encounter (INDEPENDENT_AMBULATORY_CARE_PROVIDER_SITE_OTHER): Payer: Self-pay | Admitting: Ophthalmology

## 2021-11-27 DIAGNOSIS — H34832 Tributary (branch) retinal vein occlusion, left eye, with macular edema: Secondary | ICD-10-CM

## 2021-11-27 DIAGNOSIS — H35033 Hypertensive retinopathy, bilateral: Secondary | ICD-10-CM | POA: Diagnosis not present

## 2021-11-27 DIAGNOSIS — H2513 Age-related nuclear cataract, bilateral: Secondary | ICD-10-CM

## 2021-11-27 MED ORDER — BEVACIZUMAB CHEMO INJECTION 1.25MG/0.05ML SYRINGE FOR KALEIDOSCOPE
1.2500 mg | INTRAVITREAL | Status: AC | PRN
  Administered 2021-11-27: 1.25 mg via INTRAVITREAL

## 2021-11-27 NOTE — Assessment & Plan Note (Signed)
OS, status post Avastin No. 1 for massive CME secondary to BRVO.  Much improved post injection #1.  Repeat injection today and maintain 5-week follow-up again

## 2021-11-27 NOTE — Assessment & Plan Note (Signed)
HTN controlled

## 2021-11-27 NOTE — Assessment & Plan Note (Signed)
Okay to proceed with cataract surgery at any time as per decision of Dr. Katy Fitch and patient

## 2021-11-27 NOTE — Progress Notes (Signed)
11/27/2021     CHIEF COMPLAINT Patient presents for  Chief Complaint  Patient presents with   Branch Retinal Vein Occlusion   OS.  Status post injection Avastin, first injection   HISTORY OF PRESENT ILLNESS: Douglas Esparza is a 76 y.o. male who presents to the clinic today for:   HPI   5 weeks dilate OS Avastin OCT Pt states his vision has been stable Pt denies any new floaters or FOL  Last edited by Morene Rankins, CMA on 11/27/2021 10:02 AM.      Referring physician: No referring provider defined for this encounter.  HISTORICAL INFORMATION:   Selected notes from the MEDICAL RECORD NUMBER    Lab Results  Component Value Date   HGBA1C 5.7 (H) 10/10/2020     CURRENT MEDICATIONS: No current outpatient medications on file. (Ophthalmic Drugs)   No current facility-administered medications for this visit. (Ophthalmic Drugs)   Current Outpatient Medications (Other)  Medication Sig   amLODipine (NORVASC) 10 MG tablet Take 10 mg by mouth daily.   atorvastatin (LIPITOR) 80 MG tablet Take 40 mg by mouth at bedtime.   carvedilol (COREG) 3.125 MG tablet Take 1 tablet (3.125 mg total) by mouth 2 (two) times daily with a meal.   Cholecalciferol (VITAMIN D3) 50 MCG (2000 UT) TABS Take 2,000 Units by mouth daily.   ferrous sulfate 325 (65 FE) MG tablet Take 325 mg by mouth every other day.   Multiple Vitamins-Minerals (CENTRUM SILVER ADULT 50+ PO) Take 1 tablet by mouth daily.   OVER THE COUNTER MEDICATION Take 2 capsules by mouth daily. Fruit and vegetable vitamin   sodium bicarbonate 650 MG tablet Take 650 mg by mouth 2 (two) times daily.   No current facility-administered medications for this visit. (Other)      REVIEW OF SYSTEMS: ROS   Negative for: Constitutional, Gastrointestinal, Neurological, Skin, Genitourinary, Musculoskeletal, HENT, Endocrine, Cardiovascular, Eyes, Respiratory, Psychiatric, Allergic/Imm, Heme/Lymph Last edited by Orene Desanctis D, CMA on  11/27/2021 10:02 AM.       ALLERGIES No Known Allergies  PAST MEDICAL HISTORY Past Medical History:  Diagnosis Date   Hypertension    History reviewed. No pertinent surgical history.  FAMILY HISTORY History reviewed. No pertinent family history.  SOCIAL HISTORY Social History   Tobacco Use   Smoking status: Never   Smokeless tobacco: Never  Substance Use Topics   Alcohol use: Yes    Alcohol/week: 6.0 standard drinks of alcohol    Types: 6 Cans of beer per week   Drug use: Never         OPHTHALMIC EXAM:  Base Eye Exam     Visual Acuity (ETDRS)       Right Left   Dist cc 20/25 20/50 +3         Tonometry (Tonopen, 10:08 AM)       Right Left   Pressure 10 11         Pupils       Pupils APD   Right PERRL None   Left PERRL          Visual Fields       Left Right    Full Full         Extraocular Movement       Right Left    Ortho Ortho    -- -- --  --  --  -- -- --   -- -- --  --  --  -- -- --  Neuro/Psych     Oriented x3: Yes         Dilation     Left eye: 2.5% Phenylephrine, 1.0% Mydriacyl @ 10:03 AM           Slit Lamp and Fundus Exam     External Exam       Right Left   External Normal Normal         Slit Lamp Exam       Right Left   Lids/Lashes Normal Normal   Conjunctiva/Sclera White and quiet White and quiet   Cornea Clear Clear   Anterior Chamber Deep and quiet Deep and quiet   Iris Round and reactive Round and reactive   Lens 3+ Nuclear sclerosis,  3+ Nuclear sclerosis   Anterior Vitreous Normal Normal         Fundus Exam       Right Left   Posterior Vitreous Normal Normal   Disc Normal Normal   C/D Ratio 0.45 0.45   Macula Microaneurysms Moderate clinically significant macular edema or macular BRVO superiorly improved. Tough to see details thru lens   Vessels Normal Normal   Periphery Normal Normal            IMAGING AND PROCEDURES  Imaging and Procedures for  11/27/21  OCT, Retina - OU - Both Eyes       Right Eye Quality was borderline. Scan locations included subfoveal. Central Foveal Thickness: 227. Progression has no prior data. Findings include normal foveal contour.   Left Eye Quality was good. Scan locations included subfoveal. Central Foveal Thickness: 283. Progression has no prior data. Findings include abnormal foveal contour, cystoid macular edema.   Notes Involved CSME with serous retinal detachment in conjunction with likely BRVO of the macula OS.  Improved with injection of 1 Avastin     Intravitreal Injection, Pharmacologic Agent - OS - Left Eye       Time Out 11/27/2021. 10:58 AM. Confirmed correct patient, procedure, site, and patient consented.   Anesthesia Topical anesthesia was used. Anesthetic medications included Lidocaine 4%.   Procedure Preparation included 5% betadine to ocular surface, 10% betadine to eyelids. A 30 gauge needle was used.   Injection: 1.25 mg Bevacizumab 1.25mg /0.53ml   Route: Intravitreal, Site: Left Eye   NDC: H061816, Lot: [id number], Expiration date: 02/20/2022   Post-op Post injection exam found visual acuity of at least counting fingers. The patient tolerated the procedure well. There were no complications. The patient received written and verbal post procedure care education.              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 1 for massive CME secondary to BRVO.  Much improved post injection #1.  Repeat injection today and maintain 5-week follow-up again  Nuclear sclerotic cataract of both eyes Okay to proceed with cataract surgery at any time as per decision of Dr. Katy Fitch and patient  Hypertensive retinopathy of both eyes HTN controlled     ICD-10-CM   1. 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 Nuclear sclerotic cataract of both eyes  H25.13     3. Hypertensive retinopathy of both eyes  H35.033       1.  OS repeat  injection Avastin today for repeat for CME secondary to retinal branch vein occlusion improved after first injection  2.  Bilateral cataract monitor per Dr. Katy Fitch  3.  Confirmed blood pressure is also stable  Ophthalmic Meds Ordered this visit:  Meds ordered this encounter  Medications   Bevacizumab (AVASTIN) SOLN 1.25 mg       Return in about 5 weeks (around 01/01/2022) for dilate, OS, AVASTIN OCT.  There are no Patient Instructions on file for this visit.   Explained the diagnoses, plan, and follow up with the patient and they expressed understanding.  Patient expressed understanding of the importance of proper follow up care.   Clent Demark Mikiah Demond M.D. Diseases & Surgery of the Retina and Vitreous Retina & Diabetic [place] 11/27/21     Abbreviations: M myopia (nearsighted); A astigmatism; H hyperopia (farsighted); P presbyopia; Mrx spectacle prescription;  CTL contact lenses; OD right eye; OS left eye; OU both eyes  XT exotropia; ET esotropia; PEK punctate epithelial keratitis; PEE punctate epithelial erosions; DES dry eye syndrome; MGD meibomian gland dysfunction; ATs artificial tears; PFAT's preservative free artificial tears; [place] nuclear sclerotic cataract; PSC posterior subcapsular cataract; ERM epi-retinal membrane; PVD posterior vitreous detachment; RD retinal detachment; DM diabetes mellitus; DR diabetic retinopathy; NPDR non-proliferative diabetic retinopathy; PDR proliferative diabetic retinopathy; CSME clinically significant macular edema; DME diabetic macular edema; dbh dot blot hemorrhages; CWS cotton wool spot; POAG primary open angle glaucoma; C/D cup-to-disc ratio; HVF humphrey visual field; GVF goldmann visual field; OCT optical coherence tomography; IOP intraocular pressure;  BRVO Branch retinal vein occlusion; CRVO central retinal vein occlusion; CRAO central retinal artery occlusion; BRAO branch retinal artery occlusion; RT retinal tear; SB scleral buckle; PPV pars plana vitrectomy; VH Vitreous hemorrhage; PRP panretinal laser photocoagulation; IVK intravitreal kenalog; VMT vitreomacular traction; MH Macular hole;  NVD neovascularization of the disc; NVE neovascularization elsewhere; AREDS age related eye disease study; ARMD age related macular degeneration; POAG primary open angle glaucoma; EBMD epithelial/anterior basement membrane dystrophy; ACIOL anterior chamber intraocular lens; IOL intraocular lens; PCIOL posterior chamber intraocular lens; Phaco/IOL phacoemulsification with intraocular lens placement; [place] photorefractive keratectomy; LASIK laser assisted in situ keratomileusis; HTN hypertension; DM diabetes mellitus; COPD chronic obstructive pulmonary disease

## 2022-01-01 ENCOUNTER — Ambulatory Visit (INDEPENDENT_AMBULATORY_CARE_PROVIDER_SITE_OTHER): Payer: Medicare Other | Admitting: Ophthalmology

## 2022-01-01 ENCOUNTER — Encounter (INDEPENDENT_AMBULATORY_CARE_PROVIDER_SITE_OTHER): Payer: Self-pay | Admitting: Ophthalmology

## 2022-01-01 DIAGNOSIS — H34832 Tributary (branch) retinal vein occlusion, left eye, with macular edema: Secondary | ICD-10-CM | POA: Diagnosis not present

## 2022-01-01 MED ORDER — BEVACIZUMAB CHEMO INJECTION 1.25MG/0.05ML SYRINGE FOR KALEIDOSCOPE
1.2500 mg | INTRAVITREAL | Status: AC | PRN
  Administered 2022-01-01: 1.25 mg via INTRAVITREAL

## 2022-01-01 NOTE — Assessment & Plan Note (Signed)
Improved overall, at 5 weeks, will now extend patient interval to 7 to 8 weeks

## 2022-01-01 NOTE — Progress Notes (Signed)
01/01/2022     CHIEF COMPLAINT Patient presents for  Chief Complaint  Patient presents with   Branch Retinal Vein Occlusion      HISTORY OF PRESENT ILLNESS: Douglas Esparza is a 76 y.o. 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      Referring physician: No referring provider defined for this encounter.  HISTORICAL INFORMATION:   Selected notes from the MEDICAL RECORD NUMBER    Lab Results  Component Value Date   HGBA1C 5.7 (H) 10/10/2020     CURRENT MEDICATIONS: No current outpatient medications on file. (Ophthalmic Drugs)   No current facility-administered medications for this visit. (Ophthalmic Drugs)   Current Outpatient Medications (Other)  Medication Sig   amLODipine (NORVASC) 10 MG tablet Take 10 mg by mouth daily.   atorvastatin (LIPITOR) 80 MG tablet Take 40 mg by mouth at bedtime.   carvedilol (COREG) 3.125 MG tablet Take 1 tablet (3.125 mg total) by mouth 2 (two) times daily with a meal.   Cholecalciferol (VITAMIN D3) 50 MCG (2000 UT) TABS Take 2,000 Units by mouth daily.   ferrous sulfate 325 (65 FE) MG tablet Take 325 mg by mouth every other day.   Multiple Vitamins-Minerals (CENTRUM SILVER ADULT 50+ PO) Take 1 tablet by mouth daily.   OVER THE COUNTER MEDICATION Take 2 capsules by mouth daily. Fruit and vegetable vitamin   sodium bicarbonate 650 MG tablet Take 650 mg by mouth 2 (two) times daily.   No current facility-administered medications for this visit. (Other)      REVIEW OF SYSTEMS: ROS   Negative for: Constitutional, Gastrointestinal, Neurological, Skin, Genitourinary, Musculoskeletal, HENT, Endocrine, Cardiovascular, Eyes, Respiratory, Psychiatric, Allergic/Imm,  Heme/Lymph Last edited by Orene Desanctis D, CMA on 01/01/2022  9:30 AM.       ALLERGIES No Known Allergies  PAST MEDICAL HISTORY Past Medical History:  Diagnosis Date   Hypertension    History reviewed. No pertinent surgical history.  FAMILY HISTORY History reviewed. No pertinent family history.  SOCIAL HISTORY Social History   Tobacco Use   Smoking status: Never   Smokeless tobacco: Never  Substance Use Topics   Alcohol use: Yes    Alcohol/week: 6.0 standard drinks of alcohol    Types: 6 Cans of beer per week   Drug use: Never         OPHTHALMIC EXAM:  Base Eye Exam     Visual Acuity (ETDRS)       Right Left   Dist cc 20/25 -2 20/50 -1    Correction: Glasses         Tonometry (Tonopen, 9:36 AM)       Right Left   Pressure 7 8         Pupils       Pupils APD   Right PERRL None   Left PERRL None         Visual Fields       Left Right    Full Full         Extraocular Movement       Right Left    Full, Ortho Full, Ortho  Neuro/Psych     Oriented x3: Yes         Dilation     Left eye: 2.5% Phenylephrine, 1.0% Mydriacyl @ 9:31 AM           Slit Lamp and Fundus Exam     External Exam       Right Left   External Normal Normal         Slit Lamp Exam       Right Left   Lids/Lashes Normal Normal   Conjunctiva/Sclera White and quiet White and quiet   Cornea Clear Clear   Anterior Chamber Deep and quiet Deep and quiet   Iris Round and reactive Round and reactive   Lens 3+ Nuclear sclerosis,  3+ Nuclear sclerosis   Anterior Vitreous Normal Normal         Fundus Exam       Right Left   Posterior Vitreous Normal Normal   Disc Normal Normal   C/D Ratio 0.45 0.45   Macula Microaneurysms Moderate clinically significant macular edema or macular BRVO superiorly improved. Tough to see details thru lens   Vessels Normal Normal   Periphery Normal Normal            IMAGING AND PROCEDURES  Imaging  and Procedures for 01/01/22  OCT, Retina - OU - Both Eyes       Right Eye Quality was borderline. Scan locations included subfoveal. Central Foveal Thickness: 227. Progression has no prior data. Findings include normal foveal contour.   Left Eye Quality was good. Scan locations included subfoveal. Central Foveal Thickness: 288. Progression has no prior data. Findings include abnormal foveal contour, cystoid macular edema.   Notes Involved CSME with serous retinal detachment in conjunction with likely BRVO of the macula OS.  Improved with injection of  Avastin        Intravitreal Injection, Pharmacologic Agent - OS - Left Eye       Time Out 01/01/2022. 10:08 AM. Confirmed correct patient, procedure, site, and patient consented.   Anesthesia Topical anesthesia was used. Anesthetic medications included Lidocaine 4%.   Procedure Preparation included 5% betadine to ocular surface, 10% betadine to eyelids. A 30 gauge needle was used.   Injection: 1.25 mg Bevacizumab 1.25mg /0.3ml   Route: Intravitreal, Site: Left Eye   NDC: [id number], Lot: [id number], Expiration date: 03/04/2022   Post-op Post injection exam found visual acuity of at least counting fingers. The patient tolerated the procedure well. There were no complications. The patient received written and verbal post procedure care education.              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  2.  3.  Ophthalmic Meds Ordered this visit:  Meds ordered this encounter  Medications   Bevacizumab (AVASTIN) SOLN 1.25 mg       Return in about 7 weeks (around  02/19/2022) for dilate, OS, AVASTIN OCT.  There are no Patient Instructions on file for this visit.   Explained the diagnoses, plan, and follow up with the patient and they expressed understanding.  Patient expressed understanding of the importance of proper follow up care.   Clent Demark Jeilyn Reznik M.D. Diseases & Surgery of the Retina and Vitreous [facility] [place] 01/01/22     Abbreviations: M myopia (nearsighted); A astigmatism; H hyperopia (farsighted); P presbyopia; Mrx spectacle prescription;  CTL contact lenses; OD right eye; OS left eye; OU both eyes  XT exotropia; ET esotropia; PEK punctate epithelial keratitis; PEE punctate epithelial erosions; DES dry eye syndrome; MGD meibomian gland dysfunction; ATs artificial tears; PFAT's preservative free artificial tears; [place] nuclear sclerotic cataract; PSC posterior subcapsular cataract; ERM epi-retinal membrane; PVD posterior vitreous detachment; RD retinal detachment; DM diabetes mellitus; DR diabetic retinopathy; NPDR non-proliferative diabetic retinopathy; PDR proliferative diabetic retinopathy; CSME clinically significant macular edema; DME diabetic macular edema; dbh dot blot hemorrhages; CWS cotton wool spot; POAG primary open angle glaucoma; C/D cup-to-disc ratio; HVF humphrey visual field; GVF goldmann visual field; OCT optical coherence tomography; IOP intraocular pressure; BRVO Branch retinal vein occlusion; CRVO central retinal vein occlusion; CRAO central retinal artery occlusion; BRAO branch retinal artery occlusion; RT retinal tear; SB scleral buckle; PPV pars plana vitrectomy; VH Vitreous hemorrhage; PRP panretinal laser photocoagulation; IVK intravitreal kenalog; VMT vitreomacular traction; MH Macular hole;  NVD neovascularization of the disc; NVE neovascularization elsewhere; AREDS age related eye disease study; ARMD age related macular degeneration; POAG primary open angle glaucoma; EBMD epithelial/anterior basement membrane  dystrophy; ACIOL anterior chamber intraocular lens; IOL intraocular lens; PCIOL posterior chamber intraocular lens; Phaco/IOL phacoemulsification with intraocular lens placement; [place] photorefractive keratectomy; LASIK laser assisted in situ keratomileusis; HTN hypertension; DM diabetes mellitus; COPD chronic obstructive pulmonary disease

## 2022-02-19 ENCOUNTER — Encounter (INDEPENDENT_AMBULATORY_CARE_PROVIDER_SITE_OTHER): Payer: Medicare Other | Admitting: Ophthalmology

## 2022-05-27 IMAGING — DX DG CHEST 1V PORT
1 series · 1 of 1 positions shown · non-contrast
Comparison: None.

CLINICAL DATA: Syncopal episode

EXAM:
PORTABLE CHEST 1 VIEW

[chest]
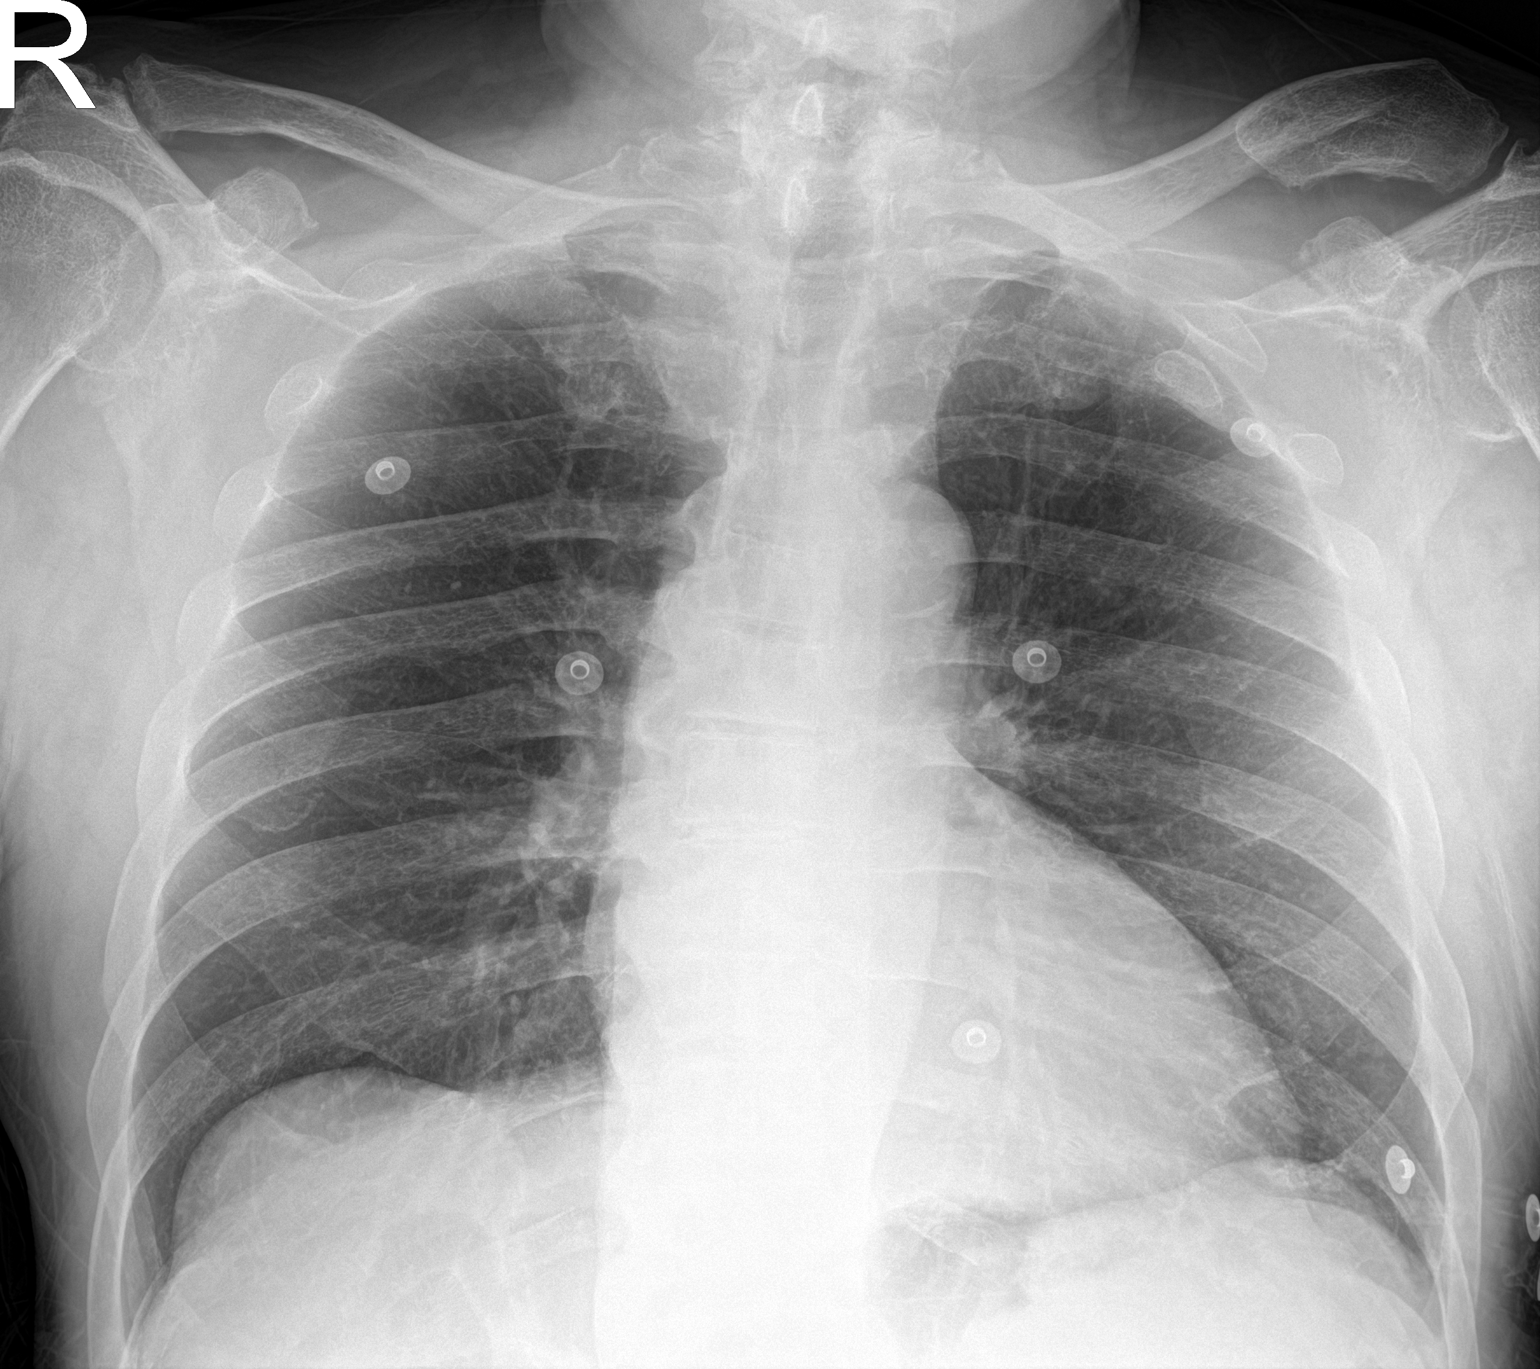

[1 of 1 positions shown; findings below may reference images not displayed]

FINDINGS: The heart size and mediastinal contours are within normal limits.
Both lungs are clear. The visualized skeletal structures are
unremarkable.
IMPRESSION: No active disease.

## 2024-04-20 DEATH — deceased
